# Patient Record
Sex: Male | Born: 1990 | Race: White | Hispanic: No | Marital: Married | Smoking: Never smoker
Health system: Southern US, Community
[De-identification: ages and names within clinical notes are randomized; demographics above are authoritative.]

---
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
## 2022-11-04 ENCOUNTER — Ambulatory Visit: Payer: BC Managed Care – PPO | Attending: Orthopedic Surgery

## 2022-11-04 DIAGNOSIS — M25571 Pain in right ankle and joints of right foot: Secondary | ICD-10-CM | POA: Diagnosis not present

## 2022-11-04 DIAGNOSIS — M25671 Stiffness of right ankle, not elsewhere classified: Secondary | ICD-10-CM | POA: Diagnosis present
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
## 2022-11-10 ENCOUNTER — Ambulatory Visit: Payer: BC Managed Care – PPO
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
## 2022-11-12 ENCOUNTER — Ambulatory Visit: Payer: BC Managed Care – PPO
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
## 2022-11-13 ENCOUNTER — Ambulatory Visit: Payer: BC Managed Care – PPO

## 2022-11-17 ENCOUNTER — Ambulatory Visit: Payer: BC Managed Care – PPO | Attending: Orthopedic Surgery

## 2022-11-17 DIAGNOSIS — M25671 Stiffness of right ankle, not elsewhere classified: Secondary | ICD-10-CM | POA: Insufficient documentation

## 2022-11-17 DIAGNOSIS — M25571 Pain in right ankle and joints of right foot: Secondary | ICD-10-CM | POA: Diagnosis present
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
## 2022-11-19 ENCOUNTER — Ambulatory Visit: Payer: BC Managed Care – PPO

## 2022-11-27 ENCOUNTER — Ambulatory Visit: Payer: BC Managed Care – PPO

## 2022-11-27 DIAGNOSIS — M25571 Pain in right ankle and joints of right foot: Secondary | ICD-10-CM | POA: Diagnosis not present

## 2022-11-27 DIAGNOSIS — M25671 Stiffness of right ankle, not elsewhere classified: Secondary | ICD-10-CM
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
## 2022-12-03 ENCOUNTER — Ambulatory Visit: Payer: BC Managed Care – PPO
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
## 2022-12-17 ENCOUNTER — Ambulatory Visit: Payer: BC Managed Care – PPO | Attending: Orthopedic Surgery

## 2022-12-30 ENCOUNTER — Ambulatory Visit: Payer: BC Managed Care – PPO

## 2023-01-01 ENCOUNTER — Ambulatory Visit: Payer: BC Managed Care – PPO

## 2023-09-25 ENCOUNTER — Ambulatory Visit
Admission: EM | Admit: 2023-09-25 | Discharge: 2023-09-25 | Disposition: A | Discharge status: Home / Self Care | Payer: BC Managed Care – PPO | Attending: Physician Assistant | Admitting: Physician Assistant

## 2023-09-25 DIAGNOSIS — W540XXA Bitten by dog, initial encounter: Secondary | ICD-10-CM | POA: Diagnosis not present

## 2023-09-25 DIAGNOSIS — S91001A Unspecified open wound, right ankle, initial encounter: Secondary | ICD-10-CM

## 2023-09-25 DIAGNOSIS — S61239A Puncture wound without foreign body of unspecified finger without damage to nail, initial encounter: Secondary | ICD-10-CM

## 2023-09-25 MED ORDER — MUPIROCIN 2 % EX OINT: 1.0000 | TOPICAL_OINTMENT | Freq: Two times a day (BID) | CUTANEOUS | 0 refills | Status: AC

## 2023-09-25 MED ORDER — AMOXICILLIN-POT CLAVULANATE 875-125 MG PO TABS: 1.0000 | ORAL_TABLET | Freq: Two times a day (BID) | ORAL | 0 refills | Status: AC

## 2023-09-25 NOTE — ED Triage Notes (Signed)
Pt c/o dog bite on the back of the right ankle. Pt states that a [place] shepard ran down the road and was attacking his 3 dogs. The dog bit him on his ankle and his right hand.

## 2023-09-25 NOTE — ED Provider Notes (Signed)
[facility]    CSN: [id number] Arrival date & time: 09/25/23  1844      History   Chief Complaint Chief Complaint  Patient presents with   Animal Bite          HPI George Harper is a 32 y.o. male presenting for dog bite injury of right posterior ankle and right hand.  Patient reports he was walking his 3 dogs with his wife.  States an [place] from down the road in his neighborhood got out and ran towards them.  Reports this dog got in a fight with his dogs and bit him as well.  This has been reported to animal control and animal control officers working on the case.  The animal has been taken and the officers checking on its rabies status.  Patient was told he will be contacted tomorrow with the rabies status results.  He presents today to have the wounds cleaned and for antibiotic prophylaxis if necessary.  Denies any significant pain.  Able to walk without difficulty.  Tetanus immunization up-to-date 2021.  HPI  History reviewed. No pertinent past medical history.  There are no problems to display for this patient.   History reviewed. No pertinent surgical history.     Home Medications    Prior to Admission medications   Medication Sig Start Date End Date Taking? Authorizing Provider  amoxicillin-clavulanate (AUGMENTIN) 875-125 MG tablet Take 1 tablet by mouth every 12 (twelve) hours for 7 days. 09/25/23 10/02/23 Yes Shirlee Latch, PA-C  mupirocin ointment (BACTROBAN) 2 % Apply 1 Application topically 2 (two) times daily. 09/25/23  Yes Shirlee Latch PA-C    Family History History reviewed. No pertinent family history.  Social History Social History   Tobacco Use   Smoking status: Never   Smokeless tobacco: Never  Vaping Use   Vaping status: Never Used  Substance Use Topics   Alcohol use: Never   Drug use: Never     Allergies   Patient has no known allergies.   Review of Systems Review of Systems  Musculoskeletal:   Negative for arthralgias and joint swelling.  Skin:  Positive for wound. Negative for color change.  Neurological:  Negative for weakness and numbness.     Physical Exam Triage Vital Signs ED Triage Vitals  Encounter Vitals Group     BP      Systolic BP Percentile      Diastolic BP Percentile      Pulse      Resp      Temp      Temp src      SpO2      Weight      Height      Head Circumference      Peak Flow      Pain Score      Pain Loc      Pain Education      Exclude from Growth Chart    No data found.  Updated Vital Signs BP (!) 137/91 (BP Location: Left Arm)   Pulse 73   Temp 98.7 F (37.1 C) (Oral)   Ht 6' (1.829 m)   Wt 190 lb (86.2 kg)   SpO2 96%   BMI 25.77 kg/m      Physical Exam Vitals and nursing note reviewed.  Constitutional:      General: He is not in acute distress.    Appearance: Normal appearance. He is well-developed. He is not ill-appearing.  HENT:     Head: Normocephalic and atraumatic.  Eyes:     General: No scleral icterus.    Conjunctiva/sclera: Conjunctivae normal.  Cardiovascular:     Rate and Rhythm: Normal rate.     Pulses: Normal pulses.  Pulmonary:     Effort: Pulmonary effort is normal. No respiratory distress.  Musculoskeletal:     Cervical back: Neck supple.  Skin:    General: Skin is warm and dry.     Capillary Refill: Capillary refill takes less than 2 seconds.     Findings: Wound present.     Comments: Right posterior ankle: Multiple small puncture wounds of the posterior ankle with slight swelling.  No erythema.  No contamination.  Full range of motion of ankle.  Good pulses.  Right hand: Multiple superficial abrasions and punctures of right hand without erythema or swelling.  Full range of motion of fingers.  Good pulses.  Neurological:     General: No focal deficit present.     Mental Status: He is alert. Mental status is at baseline.     Motor: No weakness.     Gait: Gait normal.  Psychiatric:        Mood  and Affect: Mood normal.      UC Treatments / Results  Labs (all labs ordered are listed, but only abnormal results are displayed) Labs Reviewed - No data to display  EKG   Radiology No results found.  Procedures Procedures (including critical care time)  Medications Ordered in UC Medications - No data to display  Initial Impression / Assessment and Plan / UC Course  I have reviewed the triage vital signs and the nursing notes.  Pertinent labs & imaging results that were available during my care of the patient were reviewed by me and considered in my medical decision making (see chart for details).   32 year old male presents for dog bite of right posterior ankle and right hand.  A neighborhood dog got loose and attacked his dog and him.  Wounds have not been clean.  He would like to have the wounds thoroughly cleaned.  His tetanus immunization is up-to-date as of 2021.  He says the animal should be up-to-date with its rabies as it has been taking in a couple of times due to other bite violations.  Reports he is posterior back from the animal control officer tomorrow regarding if the animal is up-to-date on his rabies vaccines.  Patient would like to hold off on rabies vaccination until he gets this report.  We discussed that rabies prophylaxis should start as soon as possible within 24 to 72 hours.  Wound thoroughly cleansed by nursing staff.  Sent Augmentin and mupirocin ointment prophylaxis.  Discussed wound care guidelines.  Reviewed supportive care with Tylenol, Motrin, ice, rest.  Reviewed returning for signs of infection.  If needed, patient may return for rabies prophylaxis.  Will contact patient tomorrow to verify his decision.   Final Clinical Impressions(s) / UC Diagnoses   Final diagnoses:  Open wound of right ankle, initial encounter  Puncture wound of finger of right hand, initial encounter  Dog bite, initial encounter     Discharge Instructions      -We have  cleaned these wounds.  Clean them yourself daily with soap and water.  Apply Mupirocin and cover with bandages until the wounds heal. - I sent an antibiotic to the pharmacy.  This should prevent infection but if you notice increased redness, swelling, pustular drainage please return  for reevaluation as you may need different medication. - Contact the office or tomorrow to check the rabies status of the animal.  -Rabies vaccinations should be started as soon as possible after exposure to the rabies virus, ideally within 24 hours but no later than 72 hours.  -Rabies PEP consists of wound washing, a dose of human rabies immune globulin (HRIG) and rabies vaccine given at the time of your first medical visit, and a dose of vaccine given again on days 3, 7, and 14 after the first dose      ED Prescriptions     Medication Sig Dispense Auth. Provider   amoxicillin-clavulanate (AUGMENTIN) 875-125 MG tablet Take 1 tablet by mouth every 12 (twelve) hours for 7 days. 14 tablet Eusebio Friendly B, PA-C   mupirocin ointment (BACTROBAN) 2 % Apply 1 Application topically 2 (two) times daily. 22 g Shirlee Latch, PA-C      PDMP not reviewed this encounter.   Shirlee Latch, PA-C 09/25/23 1945

## 2023-09-25 NOTE — Discharge Instructions (Addendum)
-  We have cleaned these wounds.  Clean them yourself daily with soap and water.  Apply Mupirocin and cover with bandages until the wounds heal. - I sent an antibiotic to the pharmacy.  This should prevent infection but if you notice increased redness, swelling, pustular drainage please return for reevaluation as you may need different medication. - Contact the office or tomorrow to check the rabies status of the animal.  -Rabies vaccinations should be started as soon as possible after exposure to the rabies virus, ideally within 24 hours but no later than 72 hours.  -Rabies PEP consists of wound washing, a dose of human rabies immune globulin (HRIG) and rabies vaccine given at the time of your first medical visit, and a dose of vaccine given again on days 3, 7, and 14 after the first dose

## 2023-09-26 ENCOUNTER — Telehealth: Payer: Self-pay

## 2023-09-26 NOTE — Telephone Encounter (Signed)
Called to speak to pt concerning the dog bite and confirmation of dog rabies vaccination. Spoke to patient and he confirmed that he did receive a call concerning the dog that bit him. The dog has received its rabies vaccine and he is not concerned with receiving the rabies vaccine. Pt had no further questions and provider has been made aware.
